# Patient Record
Sex: Female | Born: 2000 | Race: White | Hispanic: No | Marital: Single
Health system: Southern US, Community
[De-identification: ages and names within clinical notes are randomized; demographics above are authoritative.]

## PROBLEM LIST (undated history)

## (undated) DIAGNOSIS — F32A Depression, unspecified: Secondary | ICD-10-CM

## (undated) DIAGNOSIS — F419 Anxiety disorder, unspecified: Secondary | ICD-10-CM

---
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
## 2021-03-01 DIAGNOSIS — Z0000 Encounter for general adult medical examination without abnormal findings: Secondary | ICD-10-CM | POA: Diagnosis not present

## 2021-04-09 DIAGNOSIS — Z111 Encounter for screening for respiratory tuberculosis: Secondary | ICD-10-CM | POA: Diagnosis not present

## 2021-04-12 DIAGNOSIS — Z111 Encounter for screening for respiratory tuberculosis: Secondary | ICD-10-CM | POA: Diagnosis not present

## 2021-11-11 DIAGNOSIS — F331 Major depressive disorder, recurrent, moderate: Secondary | ICD-10-CM | POA: Diagnosis not present

## 2021-11-11 DIAGNOSIS — F4312 Post-traumatic stress disorder, chronic: Secondary | ICD-10-CM | POA: Diagnosis not present

## 2021-11-18 DIAGNOSIS — F4312 Post-traumatic stress disorder, chronic: Secondary | ICD-10-CM | POA: Diagnosis not present

## 2021-11-18 DIAGNOSIS — F331 Major depressive disorder, recurrent, moderate: Secondary | ICD-10-CM | POA: Diagnosis not present

## 2021-12-09 DIAGNOSIS — F331 Major depressive disorder, recurrent, moderate: Secondary | ICD-10-CM | POA: Diagnosis not present

## 2021-12-09 DIAGNOSIS — F4312 Post-traumatic stress disorder, chronic: Secondary | ICD-10-CM | POA: Diagnosis not present

## 2021-12-16 DIAGNOSIS — F4312 Post-traumatic stress disorder, chronic: Secondary | ICD-10-CM | POA: Diagnosis not present

## 2021-12-16 DIAGNOSIS — F331 Major depressive disorder, recurrent, moderate: Secondary | ICD-10-CM | POA: Diagnosis not present

## 2021-12-23 DIAGNOSIS — F4312 Post-traumatic stress disorder, chronic: Secondary | ICD-10-CM | POA: Diagnosis not present

## 2021-12-23 DIAGNOSIS — F331 Major depressive disorder, recurrent, moderate: Secondary | ICD-10-CM | POA: Diagnosis not present

## 2021-12-30 DIAGNOSIS — F331 Major depressive disorder, recurrent, moderate: Secondary | ICD-10-CM | POA: Diagnosis not present

## 2021-12-30 DIAGNOSIS — F4312 Post-traumatic stress disorder, chronic: Secondary | ICD-10-CM | POA: Diagnosis not present

## 2022-01-06 DIAGNOSIS — F4312 Post-traumatic stress disorder, chronic: Secondary | ICD-10-CM | POA: Diagnosis not present

## 2022-01-06 DIAGNOSIS — F331 Major depressive disorder, recurrent, moderate: Secondary | ICD-10-CM | POA: Diagnosis not present

## 2022-02-10 DIAGNOSIS — F4312 Post-traumatic stress disorder, chronic: Secondary | ICD-10-CM | POA: Diagnosis not present

## 2022-02-10 DIAGNOSIS — F331 Major depressive disorder, recurrent, moderate: Secondary | ICD-10-CM | POA: Diagnosis not present

## 2022-02-17 DIAGNOSIS — F331 Major depressive disorder, recurrent, moderate: Secondary | ICD-10-CM | POA: Diagnosis not present

## 2022-02-17 DIAGNOSIS — F4312 Post-traumatic stress disorder, chronic: Secondary | ICD-10-CM | POA: Diagnosis not present

## 2022-02-24 DIAGNOSIS — F331 Major depressive disorder, recurrent, moderate: Secondary | ICD-10-CM | POA: Diagnosis not present

## 2022-02-24 DIAGNOSIS — F4312 Post-traumatic stress disorder, chronic: Secondary | ICD-10-CM | POA: Diagnosis not present

## 2022-03-03 DIAGNOSIS — F331 Major depressive disorder, recurrent, moderate: Secondary | ICD-10-CM | POA: Diagnosis not present

## 2022-03-03 DIAGNOSIS — F4312 Post-traumatic stress disorder, chronic: Secondary | ICD-10-CM | POA: Diagnosis not present

## 2022-03-10 DIAGNOSIS — F4312 Post-traumatic stress disorder, chronic: Secondary | ICD-10-CM | POA: Diagnosis not present

## 2022-03-10 DIAGNOSIS — F331 Major depressive disorder, recurrent, moderate: Secondary | ICD-10-CM | POA: Diagnosis not present

## 2022-03-24 DIAGNOSIS — F4312 Post-traumatic stress disorder, chronic: Secondary | ICD-10-CM | POA: Diagnosis not present

## 2022-03-24 DIAGNOSIS — F331 Major depressive disorder, recurrent, moderate: Secondary | ICD-10-CM | POA: Diagnosis not present

## 2022-03-31 DIAGNOSIS — F4312 Post-traumatic stress disorder, chronic: Secondary | ICD-10-CM | POA: Diagnosis not present

## 2022-03-31 DIAGNOSIS — F331 Major depressive disorder, recurrent, moderate: Secondary | ICD-10-CM | POA: Diagnosis not present

## 2022-04-07 DIAGNOSIS — F4312 Post-traumatic stress disorder, chronic: Secondary | ICD-10-CM | POA: Diagnosis not present

## 2022-04-07 DIAGNOSIS — F331 Major depressive disorder, recurrent, moderate: Secondary | ICD-10-CM | POA: Diagnosis not present

## 2022-04-21 DIAGNOSIS — F331 Major depressive disorder, recurrent, moderate: Secondary | ICD-10-CM | POA: Diagnosis not present

## 2022-04-21 DIAGNOSIS — F4312 Post-traumatic stress disorder, chronic: Secondary | ICD-10-CM | POA: Diagnosis not present

## 2022-05-08 DIAGNOSIS — F4312 Post-traumatic stress disorder, chronic: Secondary | ICD-10-CM | POA: Diagnosis not present

## 2022-05-08 DIAGNOSIS — F331 Major depressive disorder, recurrent, moderate: Secondary | ICD-10-CM | POA: Diagnosis not present

## 2022-05-16 DIAGNOSIS — F4312 Post-traumatic stress disorder, chronic: Secondary | ICD-10-CM | POA: Diagnosis not present

## 2022-05-16 DIAGNOSIS — F331 Major depressive disorder, recurrent, moderate: Secondary | ICD-10-CM | POA: Diagnosis not present

## 2022-05-23 DIAGNOSIS — F4312 Post-traumatic stress disorder, chronic: Secondary | ICD-10-CM | POA: Diagnosis not present

## 2022-05-23 DIAGNOSIS — F331 Major depressive disorder, recurrent, moderate: Secondary | ICD-10-CM | POA: Diagnosis not present

## 2022-05-30 DIAGNOSIS — F4312 Post-traumatic stress disorder, chronic: Secondary | ICD-10-CM | POA: Diagnosis not present

## 2022-05-30 DIAGNOSIS — F331 Major depressive disorder, recurrent, moderate: Secondary | ICD-10-CM | POA: Diagnosis not present

## 2022-06-13 DIAGNOSIS — F4312 Post-traumatic stress disorder, chronic: Secondary | ICD-10-CM | POA: Diagnosis not present

## 2022-06-13 DIAGNOSIS — F331 Major depressive disorder, recurrent, moderate: Secondary | ICD-10-CM | POA: Diagnosis not present

## 2022-06-20 DIAGNOSIS — F331 Major depressive disorder, recurrent, moderate: Secondary | ICD-10-CM | POA: Diagnosis not present

## 2022-06-20 DIAGNOSIS — F4312 Post-traumatic stress disorder, chronic: Secondary | ICD-10-CM | POA: Diagnosis not present

## 2022-07-04 DIAGNOSIS — F331 Major depressive disorder, recurrent, moderate: Secondary | ICD-10-CM | POA: Diagnosis not present

## 2022-07-04 DIAGNOSIS — F4312 Post-traumatic stress disorder, chronic: Secondary | ICD-10-CM | POA: Diagnosis not present

## 2022-07-11 DIAGNOSIS — F331 Major depressive disorder, recurrent, moderate: Secondary | ICD-10-CM | POA: Diagnosis not present

## 2022-07-11 DIAGNOSIS — F4312 Post-traumatic stress disorder, chronic: Secondary | ICD-10-CM | POA: Diagnosis not present

## 2022-07-23 DIAGNOSIS — F331 Major depressive disorder, recurrent, moderate: Secondary | ICD-10-CM | POA: Diagnosis not present

## 2022-07-23 DIAGNOSIS — F4312 Post-traumatic stress disorder, chronic: Secondary | ICD-10-CM | POA: Diagnosis not present

## 2022-07-30 DIAGNOSIS — F3342 Major depressive disorder, recurrent, in full remission: Secondary | ICD-10-CM | POA: Diagnosis not present

## 2022-07-30 DIAGNOSIS — F4312 Post-traumatic stress disorder, chronic: Secondary | ICD-10-CM | POA: Diagnosis not present

## 2022-08-07 DIAGNOSIS — F3342 Major depressive disorder, recurrent, in full remission: Secondary | ICD-10-CM | POA: Diagnosis not present

## 2022-08-07 DIAGNOSIS — F4312 Post-traumatic stress disorder, chronic: Secondary | ICD-10-CM | POA: Diagnosis not present

## 2022-09-03 DIAGNOSIS — F4312 Post-traumatic stress disorder, chronic: Secondary | ICD-10-CM | POA: Diagnosis not present

## 2022-09-03 DIAGNOSIS — F331 Major depressive disorder, recurrent, moderate: Secondary | ICD-10-CM | POA: Diagnosis not present

## 2022-09-03 DIAGNOSIS — F3342 Major depressive disorder, recurrent, in full remission: Secondary | ICD-10-CM | POA: Diagnosis not present

## 2022-09-15 DIAGNOSIS — S61211A Laceration without foreign body of left index finger without damage to nail, initial encounter: Secondary | ICD-10-CM | POA: Diagnosis not present

## 2022-09-15 DIAGNOSIS — W268XXA Contact with other sharp object(s), not elsewhere classified, initial encounter: Secondary | ICD-10-CM | POA: Diagnosis not present

## 2022-09-17 DIAGNOSIS — F4312 Post-traumatic stress disorder, chronic: Secondary | ICD-10-CM | POA: Diagnosis not present

## 2022-09-17 DIAGNOSIS — F3342 Major depressive disorder, recurrent, in full remission: Secondary | ICD-10-CM | POA: Diagnosis not present

## 2022-10-01 DIAGNOSIS — F4312 Post-traumatic stress disorder, chronic: Secondary | ICD-10-CM | POA: Diagnosis not present

## 2022-10-01 DIAGNOSIS — F3342 Major depressive disorder, recurrent, in full remission: Secondary | ICD-10-CM | POA: Diagnosis not present

## 2022-10-15 DIAGNOSIS — F3342 Major depressive disorder, recurrent, in full remission: Secondary | ICD-10-CM | POA: Diagnosis not present

## 2022-10-15 DIAGNOSIS — F4312 Post-traumatic stress disorder, chronic: Secondary | ICD-10-CM | POA: Diagnosis not present

## 2022-11-13 DIAGNOSIS — F3342 Major depressive disorder, recurrent, in full remission: Secondary | ICD-10-CM | POA: Diagnosis not present

## 2022-11-13 DIAGNOSIS — F4389 Other reactions to severe stress: Secondary | ICD-10-CM | POA: Diagnosis not present

## 2022-12-08 DIAGNOSIS — F4389 Other reactions to severe stress: Secondary | ICD-10-CM | POA: Diagnosis not present

## 2022-12-08 DIAGNOSIS — F3342 Major depressive disorder, recurrent, in full remission: Secondary | ICD-10-CM | POA: Diagnosis not present

## 2023-03-02 DIAGNOSIS — F3342 Major depressive disorder, recurrent, in full remission: Secondary | ICD-10-CM | POA: Diagnosis not present

## 2023-03-02 DIAGNOSIS — F4389 Other reactions to severe stress: Secondary | ICD-10-CM | POA: Diagnosis not present

## 2023-05-21 DIAGNOSIS — M25361 Other instability, right knee: Secondary | ICD-10-CM | POA: Diagnosis not present

## 2023-05-21 DIAGNOSIS — M25561 Pain in right knee: Secondary | ICD-10-CM | POA: Diagnosis not present

## 2023-05-21 DIAGNOSIS — M222X1 Patellofemoral disorders, right knee: Secondary | ICD-10-CM | POA: Diagnosis not present

## 2024-08-02 ENCOUNTER — Ambulatory Visit
Admission: EM | Admit: 2024-08-02 | Discharge: 2024-08-02 | Disposition: A | Discharge status: Home / Self Care | Attending: Emergency Medicine | Admitting: Emergency Medicine

## 2024-08-02 DIAGNOSIS — R5383 Other fatigue: Secondary | ICD-10-CM | POA: Diagnosis not present

## 2024-08-02 HISTORY — DX: Anxiety disorder, unspecified: F41.9

## 2024-08-02 HISTORY — DX: Depression, unspecified: F32.A

## 2024-08-02 LAB — COMPREHENSIVE METABOLIC PANEL WITH GFR
ALT: 13 U/L (ref 0–44)
AST: 20 U/L (ref 15–41)
Albumin: 5 g/dL (ref 3.5–5.0)
Alkaline Phosphatase: 63 U/L (ref 38–126)
Anion gap: 13 (ref 5–15)
BUN: 8 mg/dL (ref 6–20)
CO2: 26 mmol/L (ref 22–32)
Calcium: 9.9 mg/dL (ref 8.9–10.3)
Chloride: 100 mmol/L (ref 98–111)
Creatinine, Ser: 0.75 mg/dL (ref 0.44–1.00)
GFR, Estimated: >60 mL/min (ref >60)
Glucose, Bld: 84 mg/dL (ref 70–99)
Potassium: 3.9 mmol/L (ref 3.5–5.1)
Sodium: 139 mmol/L (ref 135–145)
Total Bilirubin: 0.8 mg/dL (ref 0.0–1.2)
Total Protein: 8.3 g/dL — ABNORMAL HIGH (ref 6.5–8.1)

## 2024-08-02 LAB — CBC WITH DIFFERENTIAL/PLATELET
Abs Immature Granulocytes: 0.02 K/uL (ref 0.00–0.07)
Basophils Absolute: 0 K/uL (ref 0.0–0.1)
Basophils Relative: 0 %
Eosinophils Absolute: 0 K/uL (ref 0.0–0.5)
Eosinophils Relative: 1 %
HCT: 41.8 % (ref 36.0–46.0)
Hemoglobin: 14.4 g/dL (ref 12.0–15.0)
Immature Granulocytes: 0 %
Lymphocytes Relative: 24 %
Lymphs Abs: 1.7 K/uL (ref 0.7–4.0)
MCH: 29.2 pg (ref 26.0–34.0)
MCHC: 34.4 g/dL (ref 30.0–36.0)
MCV: 84.8 fL (ref 80.0–100.0)
Monocytes Absolute: 0.5 K/uL (ref 0.1–1.0)
Monocytes Relative: 7 %
Neutro Abs: 4.8 K/uL (ref 1.7–7.7)
Neutrophils Relative %: 68 %
Platelets: 272 K/uL (ref 150–400)
RBC: 4.93 MIL/uL (ref 3.87–5.11)
RDW: 11.7 % (ref 11.5–15.5)
WBC: 7.1 K/uL (ref 4.0–10.5)
nRBC: 0 % (ref 0.0–0.2)

## 2024-08-02 LAB — POCT URINE DIPSTICK
Bilirubin, UA: NEGATIVE
Blood, UA: NEGATIVE
Glucose, UA: NEGATIVE mg/dL
Leukocytes, UA: NEGATIVE
Nitrite, UA: NEGATIVE
Protein Ur, POC: NEGATIVE mg/dL
Spec Grav, UA: 1.02 (ref 1.010–1.025)
Urobilinogen, UA: 0.2 U/dL
pH, UA: 6 (ref 5.0–8.0)

## 2024-08-02 LAB — TSH: TSH: 0.845 u[IU]/mL (ref 0.350–4.500)

## 2024-08-02 NOTE — ED Provider Notes (Signed)
 HPI  SUBJECTIVE:  Brandy Chambers. is a 23 y.o. female who presents with fatigue and intermittent dizziness since January.  States that she had a viral illness where she lost 20 pounds in January, and the weight never came back.  She states that the fatigue has gotten worse over the last few months.  She states that she has normal.  And denies any change in the amount of bleeding.  No palpitations, skin, hair or nail changes, polyuria, polydipsia, chest pain, shortness of breath, abdominal pain.  She states that she is cold all the time and reports a decreased appetite, but has changed her diet so that she is eating more healthfully.  She gets 7 to 8 hours of sleep per night.  She has tried establishing exercise routines and improving her diet without improvement in her symptoms.  No aggravating factors.  She has not seen a doctor in 5 years.  No history of thyroid disease, anemia, palpitations, diabetes.  LMP: Yesterday.  Denies the possibility of being pregnant.  PCP: None.  She has set up an appointment with [facility] on January 9.   Past Medical History:  Diagnosis Date   Anxiety    Depression     History reviewed. No pertinent surgical history.  History reviewed. No pertinent family history.  Social History   Tobacco Use   Smoking status: Never    Passive exposure: Never   Smokeless tobacco: Never  Vaping Use   Vaping status: Never Used    No current facility-administered medications for this encounter. No current outpatient medications on file.  No Known Allergies   ROS  As noted in HPI.   Physical Exam  BP 110/74 (BP Location: Left Arm)   Pulse 62   Temp 98.7 F (37.1 C) (Oral)   Resp 18   Wt 54.4 kg   LMP 07/26/2024 (Exact Date)   SpO2 100%   Constitutional: Well developed, well nourished, no acute distress Eyes: PERRL, EOMI, conjunctiva normal bilaterally HENT: Normocephalic, atraumatic,mucus membranes moist Neck: No thyromegaly, cervical  lymphadenopathy Respiratory: Clear to auscultation bilaterally, no rales, no wheezing, no rhonchi Cardiovascular: Normal rate and rhythm, no murmurs, no gallops, no rubs GI: Soft, nondistended, normal bowel sounds, nontender, no rebound, no guarding Back: no CVAT skin: No rash, skin intact Musculoskeletal:  no deformities Neurologic: Alert & oriented x 3, CN III-XII grossly intact, no motor deficits, sensation grossly intact Psychiatric: Speech and behavior appropriate   ED Course   Medications - No data to display  Orders Placed This Encounter  Procedures   Comprehensive metabolic panel    Standing Status:   Standing    Number of Occurrences:   1   CBC with Differential    Standing Status:   Standing    Number of Occurrences:   1   TSH    Standing Status:   Standing    Number of Occurrences:   1   POC Urinalysis Dipstick    Standing Status:   Standing    Number of Occurrences:   1   Results for orders placed or performed during the hospital encounter of 08/02/24 (from the past 24 hours)  POC Urinalysis Dipstick     Status: Abnormal   Collection Time: 08/02/24  5:18 PM  Result Value Ref Range   Color, UA yellow yellow   Clarity, UA clear clear   Glucose, UA negative negative mg/dL   Bilirubin, UA negative negative   Ketones, POC UA trace (  5) (A) negative mg/dL   Spec Grav, UA 8.979 8.989 - 1.025   Blood, UA negative negative   pH, UA 6.0 5.0 - 8.0   Protein Ur, POC negative negative mg/dL   Urobilinogen, UA 0.2 0.2 or 1.0 E.U./dL   Nitrite, UA Negative Negative   Leukocytes, UA Negative Negative   No results found.  ED Clinical Impression  1. Other fatigue      ED Assessment/Plan     Patient with months of fatigue and intermittent dizziness.  Denies chest pain, shortness of breath, palpitations, abdominal pain.  Discussed with her that we can do a basic workup, but that any abnormalities would need to be addressed by her primary care provider she has set up  an appointment with her PCP and [facility] in January 9.  Will check UA, TSH, CMP and CBC.  Will contact patient [phone] for any emergent abnormalities.  UA negative for UTI, hematuria, proteinuria.  Some ketones.  Discussed this with patient while in department.  Discussed labs,  MDM, treatment plan, and plan for follow-up with patient  patient agrees with plan.   No orders of the defined types were placed in this encounter.     *This clinic note was created using Dragon dictation software. Therefore, there may be occasional mistakes despite careful proofreading. ?    Van Knee, MD 08/02/24 1735

## 2024-08-02 NOTE — ED Triage Notes (Signed)
 Patient is setting up new PCP in the room.   Patient states that her sx have been going on for months  Fatigue Loss if appetite Ears ringing   Patient states that she's also getting more tonsil stones than usual

## 2024-08-02 NOTE — Discharge Instructions (Signed)
 We are checking a pleat blood count, comprehensive metabolic panel, urinalysis, and thyroid level.  We will contact you if any of these need addressing emergently.  All other abnormal labs will need to be addressed by your primary care provider.  Continue exercising, getting good sleep and eating well.

## 2024-08-03 ENCOUNTER — Ambulatory Visit: Payer: Self-pay
